# Patient Record
Sex: Female | Born: 2000 | Race: Black or African American | Hispanic: No | Marital: Single
Health system: Southern US, Community
[De-identification: ages and names within clinical notes are randomized; demographics above are authoritative.]

---
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
## 2018-10-21 ENCOUNTER — Other Ambulatory Visit: Payer: Self-pay

## 2018-10-21 DIAGNOSIS — Z20822 Contact with and (suspected) exposure to covid-19: Secondary | ICD-10-CM

## 2018-10-22 LAB — NOVEL CORONAVIRUS, NAA: SARS-CoV-2, NAA: NOT DETECTED

## 2018-12-04 ENCOUNTER — Other Ambulatory Visit: Payer: Self-pay

## 2018-12-04 DIAGNOSIS — Z20822 Contact with and (suspected) exposure to covid-19: Secondary | ICD-10-CM

## 2018-12-05 LAB — NOVEL CORONAVIRUS, NAA: SARS-CoV-2, NAA: NOT DETECTED

## 2019-01-05 DIAGNOSIS — B3731 Acute candidiasis of vulva and vagina: Secondary | ICD-10-CM | POA: Insufficient documentation

## 2019-01-05 DIAGNOSIS — B373 Candidiasis of vulva and vagina: Secondary | ICD-10-CM | POA: Insufficient documentation

## 2019-02-19 ENCOUNTER — Other Ambulatory Visit: Payer: Self-pay | Admitting: Sports Medicine

## 2020-04-11 ENCOUNTER — Other Ambulatory Visit: Payer: Self-pay | Admitting: Family Medicine

## 2020-04-11 DIAGNOSIS — R519 Headache, unspecified: Secondary | ICD-10-CM

## 2020-04-14 ENCOUNTER — Other Ambulatory Visit: Payer: Self-pay

## 2020-04-14 ENCOUNTER — Ambulatory Visit
Admission: RE | Admit: 2020-04-14 | Discharge: 2020-04-14 | Disposition: A | Discharge status: Home / Self Care | Payer: Self-pay | Source: Ambulatory Visit | Attending: Family Medicine | Admitting: Family Medicine

## 2020-04-14 DIAGNOSIS — R519 Headache, unspecified: Secondary | ICD-10-CM

## 2020-05-23 ENCOUNTER — Telehealth: Payer: Self-pay | Admitting: Family Medicine

## 2020-05-23 NOTE — Telephone Encounter (Signed)
Referral from [facility], "multiple concussions". Hardcopy referral notes in your file.

## 2020-05-26 NOTE — Telephone Encounter (Signed)
Left message for patient to call back to schedule with Dr. Smith. 

## 2020-06-03 ENCOUNTER — Telehealth: Payer: Self-pay

## 2020-06-03 NOTE — Telephone Encounter (Signed)
Left 2 messages today for patient to call back to schedule in concussion clinic.
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
## 2020-06-18 ENCOUNTER — Ambulatory Visit (INDEPENDENT_AMBULATORY_CARE_PROVIDER_SITE_OTHER): Payer: 59 | Admitting: Family Medicine

## 2020-06-18 ENCOUNTER — Encounter: Payer: Self-pay | Admitting: Family Medicine

## 2020-06-18 ENCOUNTER — Other Ambulatory Visit: Payer: Self-pay

## 2020-06-18 VITALS — BP 112/74 | HR 76 | Ht 64.5 in

## 2020-06-18 DIAGNOSIS — S0990XA Unspecified injury of head, initial encounter: Secondary | ICD-10-CM | POA: Insufficient documentation

## 2020-06-18 DIAGNOSIS — M2550 Pain in unspecified joint: Secondary | ICD-10-CM | POA: Diagnosis not present

## 2020-06-18 LAB — COMPREHENSIVE METABOLIC PANEL
ALT: 9 U/L (ref 0–35)
AST: 16 U/L (ref 0–37)
Albumin: 3.9 g/dL (ref 3.5–5.2)
Alkaline Phosphatase: 46 U/L (ref 39–117)
BUN: 8 mg/dL (ref 6–23)
CO2: 28 mEq/L (ref 19–32)
Calcium: 9 mg/dL (ref 8.4–10.5)
Chloride: 105 mEq/L (ref 96–112)
Creatinine, Ser: 0.62 mg/dL (ref 0.40–1.20)
GFR: 126.75 mL/min (ref >60.00)
Glucose, Bld: 86 mg/dL (ref 70–99)
Potassium: 4 mEq/L (ref 3.5–5.1)
Sodium: 137 mEq/L (ref 135–145)
Total Bilirubin: 0.3 mg/dL (ref 0.2–1.2)
Total Protein: 7.1 g/dL (ref 6.0–8.3)

## 2020-06-18 LAB — CBC WITH DIFFERENTIAL/PLATELET
Basophils Absolute: 0 10*3/uL (ref 0.0–0.1)
Basophils Relative: 0.5 % (ref 0.0–3.0)
Eosinophils Absolute: 0.1 10*3/uL (ref 0.0–0.7)
Eosinophils Relative: 1.7 % (ref 0.0–5.0)
HCT: 36.9 % (ref 36.0–46.0)
Hemoglobin: 12.1 g/dL (ref 12.0–15.0)
Lymphocytes Relative: 26.2 % (ref 12.0–46.0)
Lymphs Abs: 1 10*3/uL (ref 0.7–4.0)
MCHC: 32.7 g/dL (ref 30.0–36.0)
MCV: 85.4 fl (ref 78.0–100.0)
Monocytes Absolute: 0.5 10*3/uL (ref 0.1–1.0)
Monocytes Relative: 13.3 % — ABNORMAL HIGH (ref 3.0–12.0)
Neutro Abs: 2.3 10*3/uL (ref 1.4–7.7)
Neutrophils Relative %: 58.3 % (ref 43.0–77.0)
Platelets: 200 10*3/uL (ref 150.0–400.0)
RBC: 4.32 Mil/uL (ref 3.87–5.11)
RDW: 15.6 % — ABNORMAL HIGH (ref 11.5–15.5)
WBC: 3.9 10*3/uL — ABNORMAL LOW (ref 4.0–10.5)

## 2020-06-18 LAB — SEDIMENTATION RATE: Sed Rate: 5 mm/hr (ref 0–20)

## 2020-06-18 LAB — TSH: TSH: 0.83 u[IU]/mL (ref 0.35–4.50)

## 2020-06-18 LAB — FERRITIN: Ferritin: 4 ng/mL — ABNORMAL LOW (ref 10.0–291.0)

## 2020-06-18 NOTE — Assessment & Plan Note (Signed)
Patient did have a head injury.  Has had 2 and potentially 3 within 3 months.  That could be contributing.  Patient's those symptoms seem to be extremely mild for some time but unfortunately continues to have difficulty getting past stage IV with return to play progression.  Patient seems to have started developing a headache.  We discussed different diet changes being a potential.  Would like patient to continue to work with the athletic trainers.  We will get laboratory work-up to further evaluate if anything such as anemia or any other things such as thyroid could be potentially contributing.  We discussed some over-the-counter medications including fish oil that could be beneficial if this is anything such as a mild postconcussive syndrome and is a possibility secondary to the multiple head injuries within the amount of time.  Patient will be held out of neuro of any type of competition until we see her again and she can passively return to play progression.  Patient knows as well as had athletic trainer at her side that if any worsening symptoms, headache, associated with nausea vomiting or worsening difficulty patient should seek medical attention immediately.  Patient did have what appeared to be a fairly bad astigmatism in the left eye that could be contributing as well and encouraged her to see her eye doctor.  Follow-up with me again in 2 weeks.  Due to the odd nature of her story though would consider the possibility of advanced imaging if this continues.

## 2020-06-18 NOTE — Patient Instructions (Addendum)
Good to see you labs today Fish oil 2 g vitamin D 2000  COQ10 200 mcg Continue being monitored by AT See me again in 2 weeks ok to double book if worsening pain, headache, nausea or vomiting go to ER immediately. Any qeustions contact me on mychart or if you change your mind on the MRI

## 2020-06-27 NOTE — Progress Notes (Signed)
[facility] [address] [place] [address] Phone: [phone] Subjective:   Bruce Donath, am serving as a scribe for Dr. Antoine Primas. This visit occurred during the SARS-CoV-2 public health emergency.  Safety protocols were in place, including screening questions prior to the visit, additional usage of staff PPE, and extensive cleaning of exam room while observing appropriate contact time as indicated for disinfecting solutions.   I'm seeing this patient by the request  of:  Pcp, No  CC: Head injury follow-up  [id number]   06/18/2020 Patient did have a head injury.  Has had 2 and potentially 3 within 3 months.  That could be contributing.  Patient's those symptoms seem to be extremely mild for some time but unfortunately continues to have difficulty getting past stage IV with return to play progression.  Patient seems to have started developing a headache.  We discussed different diet changes being a potential.  Would like patient to continue to work with the athletic trainers.  We will get laboratory work-up to further evaluate if anything such as anemia or any other things such as thyroid could be potentially contributing.  We discussed some over-the-counter medications including fish oil that could be beneficial if this is anything such as a mild postconcussive syndrome and is a possibility secondary to the multiple head injuries within the amount of time.  Patient will be held out of neuro of any type of competition until we see her again and she can passively return to play progression.  Patient knows as well as had athletic trainer at her side that if any worsening symptoms, headache, associated with nausea vomiting or worsening difficulty patient should seek medical attention immediately.  Patient did have what appeared to be a fairly bad astigmatism in the left eye that could be contributing as well and encouraged her to see her eye  doctor.  Follow-up with me again in 2 weeks.  Due to the odd nature of her story though would consider the possibility of advanced imaging if this continues.  Update 06/30/2020 Catrice Zuleta is a 20 y.o. female coming in with complaint of head injury. Patient states that she has had some bad headaches for the past 3 days. Passed out yesterday while walking and then standing in line. Using iron and Vit C for past 2 days. Also taking fish oil. Has not seen optometrist since last visit.   Headaches 1-2 x a week. No photophobia or nausea. Rests for pain relief.   Has not worked out since last visit. Has not needed accommodations recently.  Patient still does not feel like herself.     No past medical history on file. No past surgical history on file. Social History   Socioeconomic History  . Marital status: Single    Spouse name: Not on file  . Number of children: Not on file  . Years of education: Not on file  . Highest education level: Not on file  Occupational History  . Not on file  Tobacco Use  . Smoking status: Not on file  . Smokeless tobacco: Not on file  Substance and Sexual Activity  . Alcohol use: Not on file  . Drug use: Not on file  . Sexual activity: Not on file  Other Topics Concern  . Not on file  Social History Narrative  . Not on file   Social Determinants of Health   Financial Resource Strain: Not on file  Food Insecurity: Not on file  Transportation Needs: Not on file  Physical Activity: Not on file  Stress: Not on file  Social Connections: Not on file   Not on File No family history on file.       Current Outpatient Medications (Other):  .  diclofenac Sodium (VOLTAREN) 1 % GEL, Apply 0.5 g topically 3 (three) times daily as needed for pain. .  Multiple Vitamin (MULTIVITAMIN) tablet, Take 1 tablet by mouth daily.   Reviewed prior external information including notes and imaging from  primary care provider As well as notes that were available  from care everywhere and other healthcare systems.  Past medical history, social, surgical and family history all reviewed in electronic medical record.  No pertanent information unless stated regarding to the chief complaint.   Review of Systems:  No, visual changes, nausea, vomiting, diarrhea, constipation, abdominal pain, skin rash, fevers, chills, night sweats, weight loss, swollen lymph nodes, body aches, joint swelling, chest pain, shortness of breath, mood changes. POSITIVE muscle aches, dizziness, headache  Objective  Blood pressure 98/72, pulse (!) 59, height 5' 4.5" (1.638 m), weight 112 lb (50.8 kg), SpO2 99 %.   General: No apparent distress alert and oriented x3 mood and affect normal, dressed appropriately.  HEENT: Pupils equal, extraocular movements intact no signs of any type of nystagmus. Respiratory: Patient's speak in full sentences and does not appear short of breath  Cardiovascular: No lower extremity edema, non tender, no erythema no murmurs appreciated today.  Lungs are clear to auscultation bilaterally. Gait normal with good balance and coordination.  MSK:  Non tender with full range of motion and good stability and symmetric strength and tone of shoulders, elbows, wrist, hip, knee and ankles bilaterally.  Neck exam shows the patient does have good range of motion noted. Patient is balance is doing very well.    Impression and Recommendations:     The above documentation has been reviewed and is accurate and complete Judi Saa, DO

## 2020-06-30 ENCOUNTER — Encounter: Payer: Self-pay | Admitting: Family Medicine

## 2020-06-30 ENCOUNTER — Other Ambulatory Visit: Payer: Self-pay

## 2020-06-30 ENCOUNTER — Ambulatory Visit (INDEPENDENT_AMBULATORY_CARE_PROVIDER_SITE_OTHER): Payer: 59 | Admitting: Family Medicine

## 2020-06-30 VITALS — BP 98/72 | HR 59 | Ht 64.5 in | Wt 112.0 lb

## 2020-06-30 DIAGNOSIS — S0990XA Unspecified injury of head, initial encounter: Secondary | ICD-10-CM

## 2020-06-30 NOTE — Patient Instructions (Addendum)
MRI [facility] [phone] If better before MRI, can cancel Please schedule appt 1-2 days after [phone] If symptoms worsen such as increase in headaches or passing out, please seek medical care at the emergency room

## 2020-06-30 NOTE — Assessment & Plan Note (Signed)
Patient even though has made some mild improvement and was found to have significant iron deficiency that could be contributing patient though did have an episode of passing out.  Was caught by another individual.  Patient did not sound to have any other signs that would be consistent for this.  Still feels like she has not made any progress and has not been able to start the return to play progression either.  At this point secondary to the longevity of this, the 2 fairly quick head injuries and patient having no difficulty I would like to consider the possibility of an MRI of the brain with and without contrast.  If for some reason this is normal I do feel need to consider the possibility of referral to cardiology as well but I think it would be fine and hopefully she will continue to improve.  Discussed continuing the vitamin supplementations at this time.  Patient knows if any worsening symptoms to seek medical attention immediately.  Patient was accompanied with the athletic trainer today.  We will follow-up with me again after imaging to discuss

## 2021-07-10 IMAGING — CT CT HEAD W/O CM
3 series · 14 of 47 positions shown, 16 images · non-contrast
Comparison: No pertinent prior exams available for comparison.

CLINICAL DATA: Non intractable headache, unspecified chronicity
pattern, unspecified headache type. Additional history provided by
scanning technologist: Patient reports fall during gymnastics
04/02/2020 hitting face with knees, headache and bruising to eyes.

EXAM:
CT HEAD WITHOUT CONTRAST
TECHNIQUE: Contiguous axial images were obtained from the base of the skull
through the vertex without intravenous contrast.

[Series 2: head 5.00 hr40 s3 axial soft ibhc · axial · 0.42mm/px · z∈[-576,-446]mm · 8 of 32 slices shown, 10 images]
[im 3/32  brain]
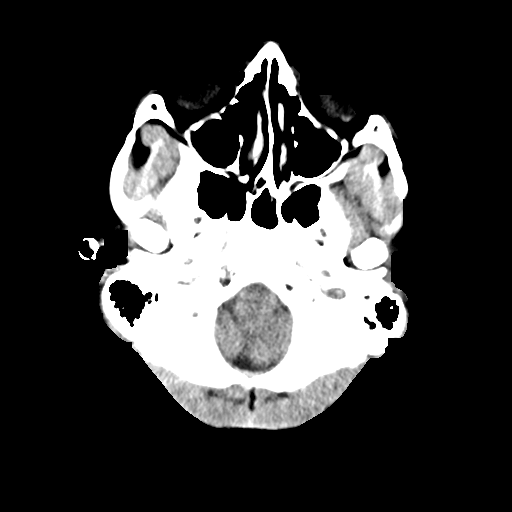
[im 3/32  bone]
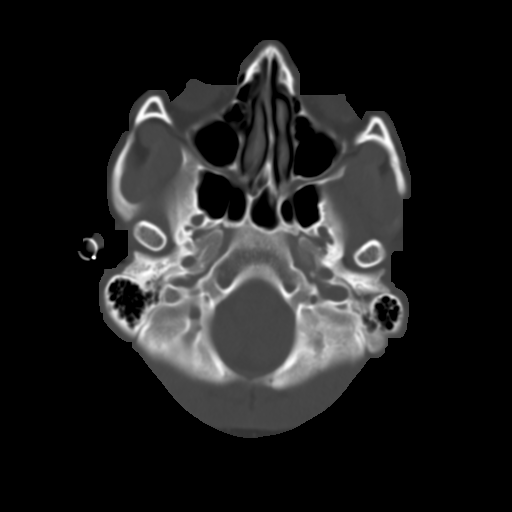
[im 7/32  brain]
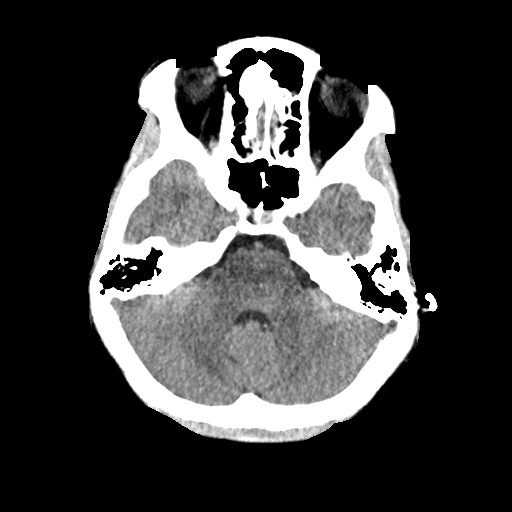
[im 10/32  brain]
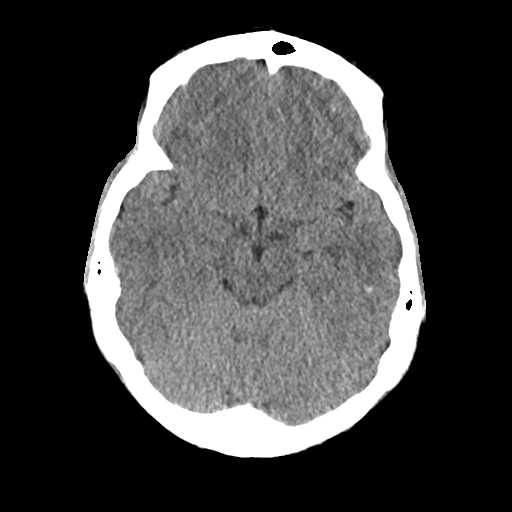
[im 14/32  brain]
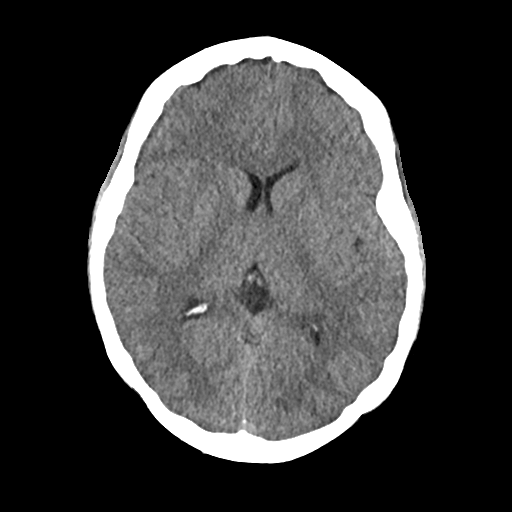
[im 18/32  brain]
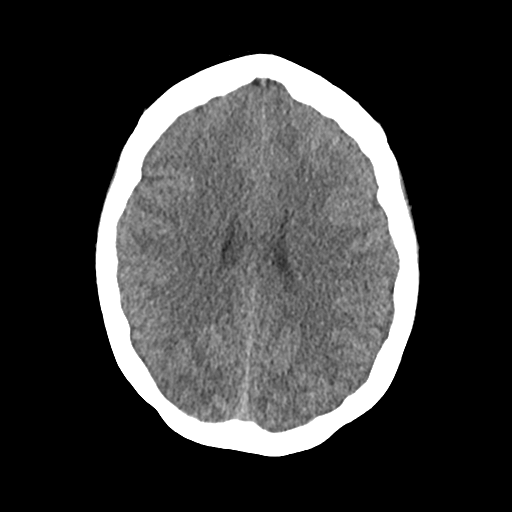
[im 18/32  bone]
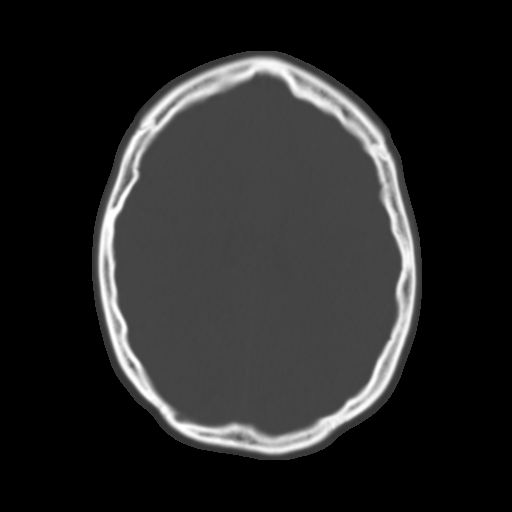
[im 22/32  brain]
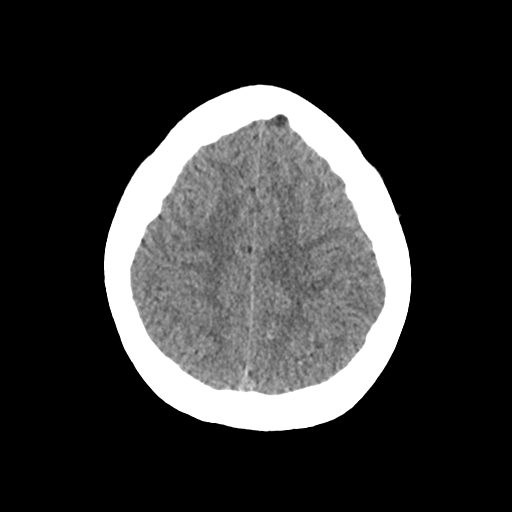
[im 25/32  brain]
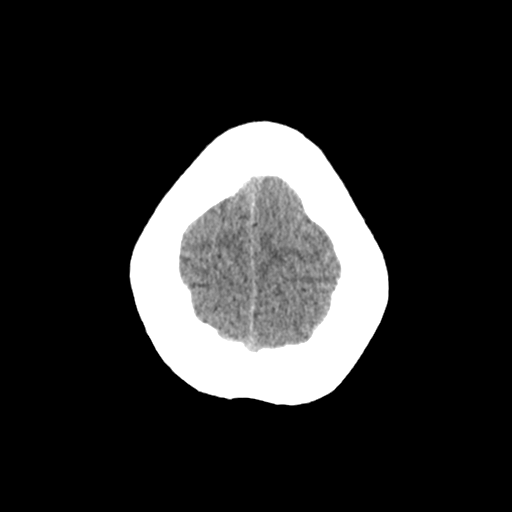
[im 29/32  brain]
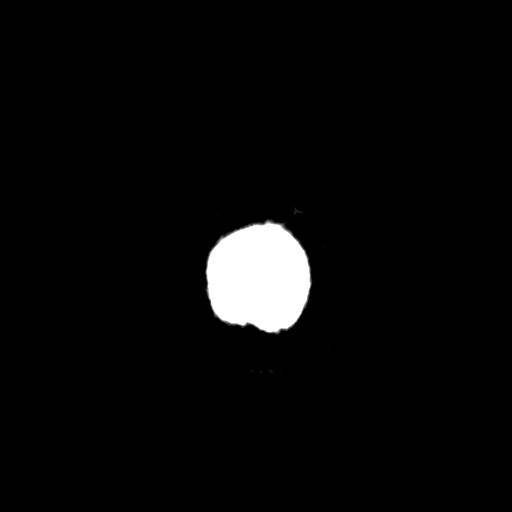

[Series 4: head 3.00 hr40 s3 sag · sagittal · 0.32mm/px · 3 of 107 slices shown]
[im 36/107  brain]
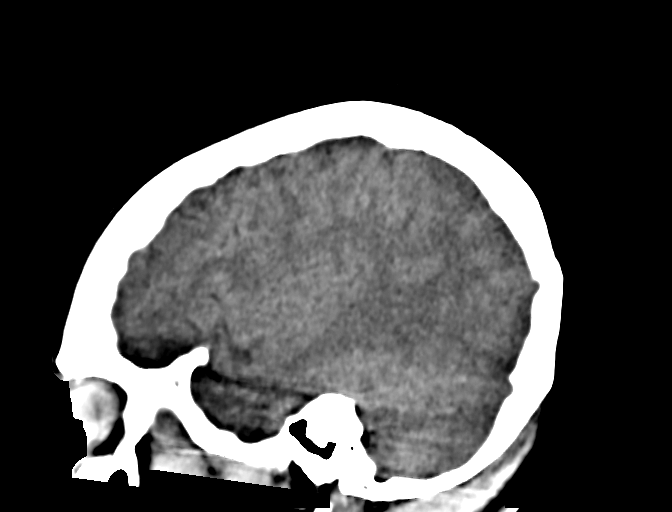
[im 54/107  brain]
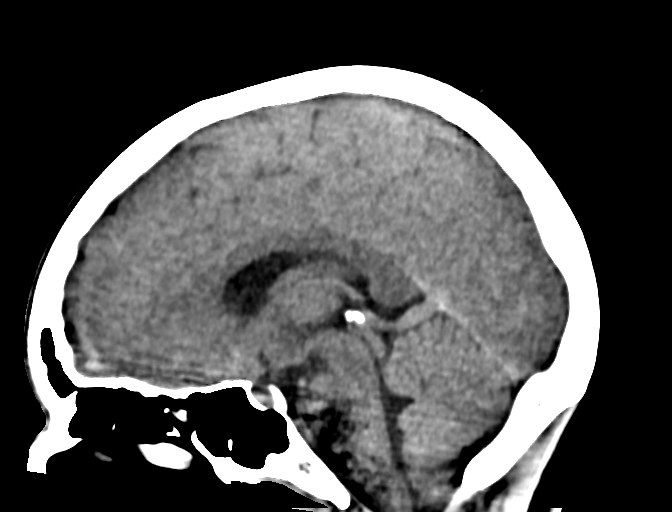
[im 71/107  brain]
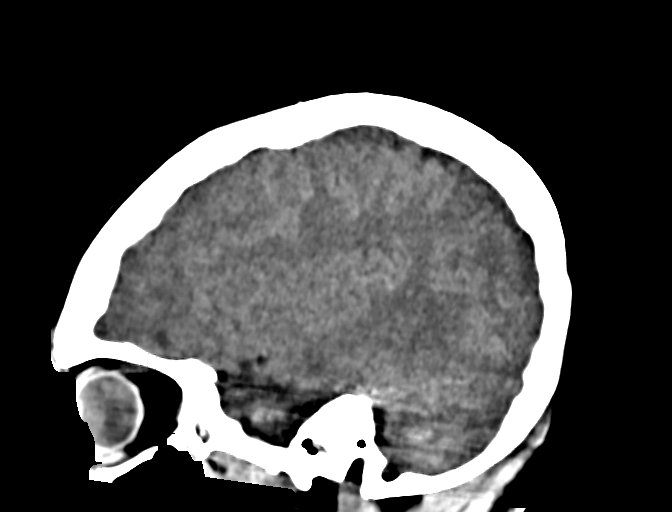

[Series 6: head 3.00 hr40 s3 cor · coronal · 0.32mm/px · 3 of 107 slices shown]
[im 36/107  brain]
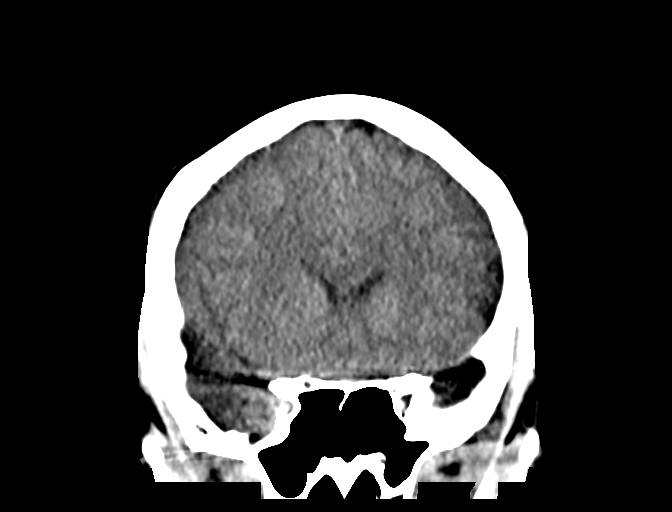
[im 48/107  brain]
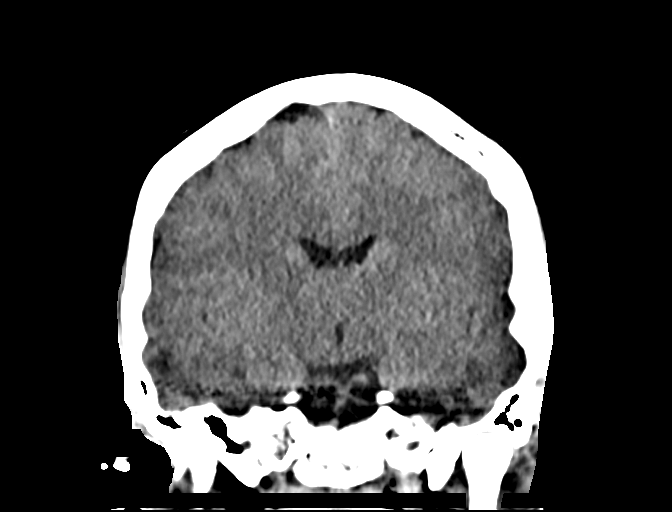
[im 59/107  brain]
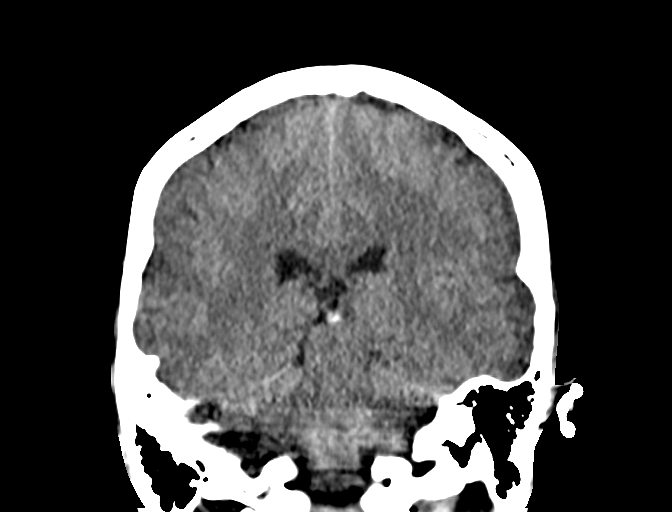

[14 of 47 positions shown; findings below may reference images not displayed]

FINDINGS: Brain:

Cerebral volume is normal.

There is no acute intracranial hemorrhage.

No demarcated cortical infarct.

No extra-axial fluid collection.

No evidence of intracranial mass.

No midline shift.

Vascular: No hyperdense vessel.

Skull: Normal. Negative for fracture or focal lesion.

Sinuses/Orbits: Visualized orbits show no acute finding. Mild
partial opacification of the left ethmoid air cells.

Other: Partially imaged deformity of the right nasal bone (series 3,
image 1).

Impression #2 will be called to the ordering clinician or
representative by the Radiologist Assistant, and communication
documented in the PACS or [REDACTED].
IMPRESSION: Unremarkable non-contrast CT appearance of the brain. No evidence of
acute intracranial abnormality.

Partially imaged age-indeterminate deformity of the right nasal
bone. A dedicated maxillofacial CT is recommended for further
evaluation if there is clinical concern for acute/subacute right
nasal bone fracture.

Mild left ethmoid sinusitis.
